# Patient Record
Sex: Female | Born: 1980 | Hispanic: No | Marital: Married | Smoking: Never smoker
Health system: Southern US, Community
[De-identification: ages and names within clinical notes are randomized; demographics above are authoritative.]

## PROBLEM LIST (undated history)

## (undated) DIAGNOSIS — O24419 Gestational diabetes mellitus in pregnancy, unspecified control: Secondary | ICD-10-CM

## (undated) HISTORY — PX: CHOLECYSTECTOMY: SHX55

---

## 2008-04-23 ENCOUNTER — Other Ambulatory Visit: Admission: RE | Admit: 2008-04-23 | Discharge: 2008-04-23 | Payer: Self-pay | Admitting: Unknown Physician Specialty

## 2008-04-23 ENCOUNTER — Encounter (INDEPENDENT_AMBULATORY_CARE_PROVIDER_SITE_OTHER): Payer: Self-pay | Admitting: Unknown Physician Specialty

## 2017-12-13 DIAGNOSIS — O24419 Gestational diabetes mellitus in pregnancy, unspecified control: Secondary | ICD-10-CM

## 2017-12-13 HISTORY — DX: Gestational diabetes mellitus in pregnancy, unspecified control: O24.419

## 2018-03-24 ENCOUNTER — Other Ambulatory Visit (INDEPENDENT_AMBULATORY_CARE_PROVIDER_SITE_OTHER): Payer: Self-pay | Admitting: Internal Medicine

## 2018-03-24 DIAGNOSIS — K8050 Calculus of bile duct without cholangitis or cholecystitis without obstruction: Secondary | ICD-10-CM

## 2018-03-27 ENCOUNTER — Encounter (HOSPITAL_COMMUNITY)
Admission: RE | Disposition: A | Discharge status: Other Acute Inpatient Hospital | Payer: Self-pay | Source: Ambulatory Visit | Attending: Internal Medicine

## 2018-03-27 ENCOUNTER — Ambulatory Visit (HOSPITAL_COMMUNITY): Payer: Medicaid Other | Admitting: Anesthesiology

## 2018-03-27 ENCOUNTER — Ambulatory Visit (HOSPITAL_COMMUNITY)
Admission: RE | Admit: 2018-03-27 | Discharge: 2018-03-27 | Disposition: A | Discharge status: Other Acute Inpatient Hospital | Payer: Medicaid Other | Source: Ambulatory Visit | Attending: Internal Medicine | Admitting: Internal Medicine

## 2018-03-27 ENCOUNTER — Ambulatory Visit (HOSPITAL_COMMUNITY): Payer: Medicaid Other

## 2018-03-27 ENCOUNTER — Encounter (HOSPITAL_COMMUNITY): Payer: Self-pay | Admitting: *Deleted

## 2018-03-27 DIAGNOSIS — Z01818 Encounter for other preprocedural examination: Secondary | ICD-10-CM | POA: Diagnosis not present

## 2018-03-27 DIAGNOSIS — K8050 Calculus of bile duct without cholangitis or cholecystitis without obstruction: Secondary | ICD-10-CM | POA: Diagnosis not present

## 2018-03-27 DIAGNOSIS — Z9049 Acquired absence of other specified parts of digestive tract: Secondary | ICD-10-CM | POA: Diagnosis not present

## 2018-03-27 DIAGNOSIS — R932 Abnormal findings on diagnostic imaging of liver and biliary tract: Secondary | ICD-10-CM | POA: Diagnosis not present

## 2018-03-27 DIAGNOSIS — Z79899 Other long term (current) drug therapy: Secondary | ICD-10-CM | POA: Insufficient documentation

## 2018-03-27 HISTORY — DX: Gestational diabetes mellitus in pregnancy, unspecified control: O24.419

## 2018-03-27 HISTORY — PX: REMOVAL OF STONES: SHX5545

## 2018-03-27 HISTORY — PX: ERCP: SHX5425

## 2018-03-27 HISTORY — PX: SPHINCTEROTOMY: SHX5544

## 2018-03-27 LAB — GLUCOSE, CAPILLARY: GLUCOSE-CAPILLARY: 98 mg/dL (ref 65–99)

## 2018-03-27 SURGERY — ERCP, WITH INTERVENTION IF INDICATED
Anesthesia: General

## 2018-03-27 MED ORDER — GLUCAGON HCL RDNA (DIAGNOSTIC) 1 MG IJ SOLR
INTRAMUSCULAR | Status: AC
  Filled 2018-03-27: qty 2

## 2018-03-27 MED ORDER — MIDAZOLAM HCL 2 MG/2ML IJ SOLN
INTRAMUSCULAR | Status: AC
  Filled 2018-03-27: qty 2

## 2018-03-27 MED ORDER — SUGAMMADEX SODIUM 200 MG/2ML IV SOLN
INTRAVENOUS | Status: DC | PRN
  Administered 2018-03-27: 200 mg via INTRAVENOUS

## 2018-03-27 MED ORDER — SIMETHICONE 40 MG/0.6ML PO SUSP
ORAL | Status: AC
  Filled 2018-03-27: qty 0.6

## 2018-03-27 MED ORDER — ROCURONIUM BROMIDE 100 MG/10ML IV SOLN
INTRAVENOUS | Status: DC | PRN
  Administered 2018-03-27: 5 mg via INTRAVENOUS

## 2018-03-27 MED ORDER — PROMETHAZINE HCL 25 MG/ML IJ SOLN: 6.2500 mg | INTRAMUSCULAR | Status: DC | PRN

## 2018-03-27 MED ORDER — MIDAZOLAM HCL 2 MG/2ML IJ SOLN: 0.5000 mg | Freq: Once | INTRAMUSCULAR | Status: DC | PRN

## 2018-03-27 MED ORDER — DEXAMETHASONE SODIUM PHOSPHATE 4 MG/ML IJ SOLN
INTRAMUSCULAR | Status: AC
  Filled 2018-03-27: qty 2

## 2018-03-27 MED ORDER — DEXAMETHASONE SODIUM PHOSPHATE 4 MG/ML IJ SOLN
INTRAMUSCULAR | Status: DC | PRN
  Administered 2018-03-27: 8 mg via INTRAVENOUS

## 2018-03-27 MED ORDER — CEFAZOLIN SODIUM-DEXTROSE 2-4 GM/100ML-% IV SOLN
2.0000 g | INTRAVENOUS | Status: AC
  Administered 2018-03-27: 2 g via INTRAVENOUS

## 2018-03-27 MED ORDER — LIDOCAINE HCL (PF) 1 % IJ SOLN
INTRAMUSCULAR | Status: AC
  Filled 2018-03-27: qty 5

## 2018-03-27 MED ORDER — ROCURONIUM BROMIDE 50 MG/5ML IV SOLN
INTRAVENOUS | Status: AC
  Filled 2018-03-27: qty 1

## 2018-03-27 MED ORDER — MIDAZOLAM HCL 5 MG/5ML IJ SOLN
INTRAMUSCULAR | Status: DC | PRN
  Administered 2018-03-27: 2 mg via INTRAVENOUS

## 2018-03-27 MED ORDER — CHLORHEXIDINE GLUCONATE CLOTH 2 % EX PADS: 6.0000 | MEDICATED_PAD | Freq: Once | CUTANEOUS | Status: DC

## 2018-03-27 MED ORDER — IOPAMIDOL (ISOVUE-M 300) INJECTION 61%
INTRAMUSCULAR | Status: DC | PRN
  Administered 2018-03-27: 50 mL

## 2018-03-27 MED ORDER — ONDANSETRON HCL 4 MG/2ML IJ SOLN
INTRAMUSCULAR | Status: DC | PRN
  Administered 2018-03-27: 4 mg via INTRAVENOUS

## 2018-03-27 MED ORDER — HYDROMORPHONE HCL 1 MG/ML IJ SOLN
0.2500 mg | INTRAMUSCULAR | Status: DC | PRN
  Administered 2018-03-27: 0.5 mg via INTRAVENOUS

## 2018-03-27 MED ORDER — FENTANYL CITRATE (PF) 100 MCG/2ML IJ SOLN
INTRAMUSCULAR | Status: DC | PRN
  Administered 2018-03-27: 15 ug via INTRAVENOUS

## 2018-03-27 MED ORDER — ONDANSETRON HCL 4 MG/2ML IJ SOLN
INTRAMUSCULAR | Status: AC
  Filled 2018-03-27: qty 2

## 2018-03-27 MED ORDER — FENTANYL CITRATE (PF) 100 MCG/2ML IJ SOLN
INTRAMUSCULAR | Status: AC
  Filled 2018-03-27: qty 2

## 2018-03-27 MED ORDER — PROPOFOL 10 MG/ML IV BOLUS
INTRAVENOUS | Status: DC | PRN
  Administered 2018-03-27: 160 mg via INTRAVENOUS

## 2018-03-27 MED ORDER — IOPAMIDOL (ISOVUE-300) INJECTION 61%
INTRAVENOUS | Status: AC
  Filled 2018-03-27: qty 100

## 2018-03-27 MED ORDER — HYDROMORPHONE HCL 1 MG/ML IJ SOLN
INTRAMUSCULAR | Status: AC
  Filled 2018-03-27: qty 0.5

## 2018-03-27 MED ORDER — CEFAZOLIN SODIUM-DEXTROSE 2-4 GM/100ML-% IV SOLN
INTRAVENOUS | Status: AC
  Filled 2018-03-27: qty 100

## 2018-03-27 MED ORDER — SUCCINYLCHOLINE CHLORIDE 20 MG/ML IJ SOLN
INTRAMUSCULAR | Status: DC | PRN
  Administered 2018-03-27: 120 mg via INTRAVENOUS

## 2018-03-27 MED ORDER — GLUCAGON HCL RDNA (DIAGNOSTIC) 1 MG IJ SOLR
INTRAMUSCULAR | Status: DC | PRN
  Administered 2018-03-27 (×2): 0.25 mg via INTRAVENOUS

## 2018-03-27 MED ORDER — PROPOFOL 10 MG/ML IV BOLUS
INTRAVENOUS | Status: AC
  Filled 2018-03-27: qty 40

## 2018-03-27 MED ORDER — STERILE WATER FOR IRRIGATION IR SOLN
Status: DC | PRN
  Administered 2018-03-27: 1000 mL

## 2018-03-27 MED ORDER — LACTATED RINGERS IV SOLN
INTRAVENOUS | Status: DC
  Administered 2018-03-27: 11:00:00 via INTRAVENOUS

## 2018-03-27 MED ORDER — ACETAMINOPHEN 10 MG/ML IV SOLN: 1000.0000 mg | Freq: Once | INTRAVENOUS | Status: DC | PRN

## 2018-03-27 NOTE — Transfer of Care (Signed)
Immediate Anesthesia Transfer of Care Note  Patient: Jasmine HumblesYuriana Ford  Procedure(s) Performed: ENDOSCOPIC RETROGRADE CHOLANGIOPANCREATOGRAPHY (ERCP) (N/A ) REMOVAL OF STONES (N/A ) SPHINCTEROTOMY  Patient Location: PACU  Anesthesia Type:General  Level of Consciousness: awake and patient cooperative  Airway & Oxygen Therapy: Patient Spontanous Breathing and Patient connected to face mask oxygen  Post-op Assessment: Report given to RN, Post -op Vital signs reviewed and stable and Patient moving all extremities  Post vital signs: Reviewed and stable  Last Vitals:  Vitals Value Taken Time  BP    Temp    Pulse    Resp    SpO2      Last Pain:  Vitals:   03/27/18 1047  TempSrc: Oral  PainSc: 0-No pain         Complications: No apparent anesthesia complications

## 2018-03-27 NOTE — Anesthesia Postprocedure Evaluation (Signed)
Anesthesia Post Note  Patient: [name]  Procedure(s) Performed: ENDOSCOPIC RETROGRADE CHOLANGIOPANCREATOGRAPHY (ERCP) (N/A ) REMOVAL OF STONES (N/A ) SPHINCTEROTOMY  Patient location during evaluation: PACU Anesthesia Type: General Level of consciousness: awake and alert and patient cooperative Pain management: pain level controlled Vital Signs Assessment: post-procedure vital signs reviewed and stable Respiratory status: spontaneous breathing, nonlabored ventilation and respiratory function stable Cardiovascular status: blood pressure returned to baseline Postop Assessment: no apparent nausea or vomiting Anesthetic complications: no     Last Vitals:  Vitals:   03/27/18 1047 03/27/18 1312  BP:  127/81  Pulse: 67 85  Resp: (!) 25 (!) 22  Temp: 37.3 C (P) 37.1 C  SpO2: 90%     Last Pain:  Vitals:   03/27/18 1047  TempSrc: Oral  PainSc: 0-No pain                 Annet Manukyan J

## 2018-03-27 NOTE — Anesthesia Preprocedure Evaluation (Addendum)
Anesthesia Evaluation  Patient identified by MRN, date of birth, ID band Patient awake    Reviewed: Allergy & Precautions, H&P , NPO status , Patient's Chart, lab work & pertinent test results, reviewed documented beta blocker date and time   Airway Mallampati: II  TM Distance: >3 FB Neck ROM: full    Dental no notable dental hx. (+) Teeth Intact   Pulmonary neg pulmonary ROS,    Pulmonary exam normal breath sounds clear to auscultation       Cardiovascular Exercise Tolerance: Good negative cardio ROS   Rhythm:regular Rate:Normal     Neuro/Psych negative neurological ROS  negative psych ROS   GI/Hepatic negative GI ROS, Neg liver ROS,   Endo/Other  negative endocrine ROSdiabetes  Renal/GU negative Renal ROS  negative genitourinary   Musculoskeletal   Abdominal   Peds  Hematology negative hematology ROS (+)   Anesthesia Other Findings Generally healthy with no clinical complaints; Lab studies from outside facility showed H&H 11.4/35.0; BMP reviewed  Reproductive/Obstetrics negative OB ROS                            Anesthesia Physical Anesthesia Plan  ASA: II  Anesthesia Plan: General   Post-op Pain Management:    Induction:   PONV Risk Score and Plan:   Airway Management Planned:   Additional Equipment:   Intra-op Plan:   Post-operative Plan:   Informed Consent: I have reviewed the patients History and Physical, chart, labs and discussed the procedure including the risks, benefits and alternatives for the proposed anesthesia with the patient or authorized representative who has indicated his/her understanding and acceptance.   Dental Advisory Given  Plan Discussed with: CRNA  Anesthesia Plan Comments:        Anesthesia Quick Evaluation

## 2018-03-27 NOTE — Progress Notes (Signed)
Patient waiting for EMS transportation return to [facility] [place], [place]

## 2018-03-27 NOTE — Anesthesia Postprocedure Evaluation (Signed)
Anesthesia Post Note  Patient: Leverne HumblesYuriana Borcherding  Procedure(s) Performed: ENDOSCOPIC RETROGRADE CHOLANGIOPANCREATOGRAPHY (ERCP) (N/A ) REMOVAL OF STONES (N/A ) SPHINCTEROTOMY  Patient location during evaluation: PACU Anesthesia Type: General Level of consciousness: awake and alert Pain management: pain level controlled Vital Signs Assessment: post-procedure vital signs reviewed and stable Respiratory status: spontaneous breathing, nonlabored ventilation, respiratory function stable and patient connected to nasal cannula oxygen Cardiovascular status: stable and blood pressure returned to baseline Postop Assessment: no apparent nausea or vomiting Anesthetic complications: no     Last Vitals:  Vitals:   03/27/18 1047  Pulse: 67  Resp: (!) 25  Temp: 37.3 C  SpO2: 90%    Last Pain:  Vitals:   03/27/18 1047  TempSrc: Oral  PainSc: 0-No pain                 Allena EaringJeffrey C Kayli Beal

## 2018-03-27 NOTE — Op Note (Signed)
[facility] Patient Name: Jasmine Ford Procedure Date: 03/27/2018 11:57 AM MRN: [id number] Date of Birth: 04-28-1981 Attending MD: Lionel December , MD CSN: [id number] Age: 37 Admit Type: Outpatient Procedure:                ERCP Indications:              For therapy of bile duct stone(s) Providers:                Lionel December, MD, Edrick Kins, RN, Burke Keels, Technician Referring MD:             Justice Britain, MD Medicines:                General Anesthesia Complications:            No immediate complications. Estimated Blood Loss:     Estimated blood loss: none. Procedure:                Pre-Anesthesia Assessment:                           - Prior to the procedure, a History and Physical                            was performed, and patient medications and                            allergies were reviewed. The patient's tolerance of                            previous anesthesia was also reviewed. The risks                            and benefits of the procedure and the sedation                            options and risks were discussed with the patient.                            All questions were answered, and informed consent                            was obtained. Prior Anticoagulants: The patient has                            taken no previous anticoagulant or antiplatelet                            agents. ASA Grade Assessment: I - A normal, healthy                            patient. After reviewing the risks and benefits,  the patient was deemed in satisfactory condition to                            undergo the procedure.                           After obtaining informed consent, the scope was                            passed under direct vision. Throughout the                            procedure, the patient's blood pressure, pulse, and                            oxygen saturations were monitored  continuously. The                            [id number]) scope was introduced through                            the mouth, and used to inject contrast into and                            used to inject contrast into the bile duct. The                            ERCP was accomplished without difficulty. The                            patient tolerated the procedure well. Scope In: 12:30:54 PM Scope Out: 12:51:35 PM Total Procedure Duration: 0 hours 20 minutes 41 seconds  Findings:      The scout film was normal. The esophagus was successfully intubated       under direct vision. The scope was advanced to a normal major papilla in       the descending duodenum without detailed examination of the pharynx,       larynx and associated structures, and upper GI tract. The upper GI tract       was grossly normal. PD was initially cannulated. Guidewire left in PD to       facilitate CBD cannulation. Contrast was not injexted into PD. 0.035       inch x 260 cm straight Hydra Jagwire was passed into the biliary tree.       The traction (standard) sphincterotome and Autotome sphincterotome were       passed over the guidewire and the bile duct was then deeply cannulated.       Contrast was injected. I personally interpreted the bile duct images.       Ductal flow of contrast was adequate. Image quality was excellent.       Contrast extended to the main bile duct. Contrast extended to the       hepatic ducts. The common bile duct contained filling defect(s) thought       to be a stone. An 8 mm biliary sphincterotomy was made with a braided  Autotome sphincterotome using ERBE electrocautery. There was no       post-sphincterotomy bleeding. The biliary tree was swept with a 9 mm       balloon starting at the upper third of the main bile duct. Four stones       were removed. No stones remained. Impression:               - Multiple(4) filling defects consistent with                             stones was seen on the cholangiogram.                           - A biliary sphincterotomy was performed.                           - Complete removal was accomplished by biliary                            sphincterotomy and balloon extraction. Moderate Sedation:      Per Anesthesia Care Recommendation:           - Avoid aspirin and nonsteroidal anti-inflammatory                            medicines for 3 days.                           - Return patient to referring hospital for ongoing                            care.                           - Clear liquid diet today.                           - Low sodium diet today. Procedure Code(s):        --- Professional ---                           [phone], Endoscopic retrograde                            cholangiopancreatography (ERCP); with removal of                            calculi/debris from biliary/pancreatic duct(s)                           43262, Endoscopic retrograde                            cholangiopancreatography (ERCP); with                            sphincterotomy/papillotomy Diagnosis Code(s):        --- Professional ---  K80.50, Calculus of bile duct without cholangitis                            or cholecystitis without obstruction                           R93.2, Abnormal findings on diagnostic imaging of                            liver and biliary tract CPT copyright 2017 American Medical Association. All rights reserved. The codes documented in this report are preliminary and upon coder review may  be revised to meet current compliance requirements. Lionel December, MD Lionel December, MD 03/27/2018 1:24:18 PM This report has been signed electronically. Number of Addenda: 0

## 2018-03-27 NOTE — Progress Notes (Signed)
Brief ERCP note:  Normal ampulla of Vater. Pancreatic duct cannulated with guidewire no contrast injected. Pancreatic guidewire left in place to facilitate selective cannulation of bile duct. Mildly dilated CBD and CHD. 4 filling defects consistent with stones. Biliary sphincterotomy performed and all stones were removed using stone balloon extractor. Patient tolerated the procedure well.

## 2018-03-27 NOTE — Anesthesia Procedure Notes (Signed)
Procedure Name: Intubation Date/Time: 03/27/2018 12:15 PM Performed by: Charmaine Downs, CRNA Pre-anesthesia Checklist: Patient identified, Patient being monitored, Timeout performed, Emergency Drugs available and Suction available Patient Re-evaluated:Patient Re-evaluated prior to induction Oxygen Delivery Method: Circle System Utilized Preoxygenation: Pre-oxygenation with 100% oxygen Induction Type: IV induction Ventilation: Mask ventilation without difficulty Laryngoscope Size: Mac and 4 Grade View: Grade I Tube type: Oral Tube size: 7.0 mm Number of attempts: 1 Airway Equipment and Method: stylet Placement Confirmation: ETT inserted through vocal cords under direct vision,  positive ETCO2 and breath sounds checked- equal and bilateral Secured at: 22 cm Tube secured with: Tape Dental Injury: Teeth and Oropharynx as per pre-operative assessment

## 2018-03-27 NOTE — Discharge Instructions (Signed)
No aspirin or anticoagulants for 72 hours. Clear liquids today.  Advance heart healthy diet starting tomorrow morning. Check LFTs and serum amylase in a.m.      General Anesthesia, Adult, Care After These instructions provide you with information about caring for yourself after your procedure. Your health care provider may also give you more specific instructions. Your treatment has been planned according to current medical practices, but problems sometimes occur. Call your health care provider if you have any problems or questions after your procedure. What can I expect after the procedure? After the procedure, it is common to have:  Vomiting.  A sore throat.  Mental slowness.  It is common to feel:  Nauseous.  Cold or shivery.  Sleepy.  Tired.  Sore or achy, even in parts of your body where you did not have surgery.  Follow these instructions at home: For at least 24 hours after the procedure:  Do not: ? Participate in activities where you could fall or become injured. ? Drive. ? Use heavy machinery. ? Drink alcohol. ? Take sleeping pills or medicines that cause drowsiness. ? Make important decisions or sign legal documents. ? Take care of children on your own.  Rest. Eating and drinking  If you vomit, drink water, juice, or soup when you can drink without vomiting.  Drink enough fluid to keep your urine clear or pale yellow.  Make sure you have little or no nausea before eating solid foods.  Follow the diet recommended by your health care provider. General instructions  Have a responsible adult stay with you until you are awake and alert.  Return to your normal activities as told by your health care provider. Ask your health care provider what activities are safe for you.  Take over-the-counter and prescription medicines only as told by your health care provider.  If you smoke, do not smoke without supervision.  Keep all follow-up visits as told by  your health care provider. This is important. Contact a health care provider if:  You continue to have nausea or vomiting at home, and medicines are not helpful.  You cannot drink fluids or start eating again.  You cannot urinate after 8-12 hours.  You develop a skin rash.  You have fever.  You have increasing redness at the site of your procedure. Get help right away if:  You have difficulty breathing.  You have chest pain.  You have unexpected bleeding.  You feel that you are having a life-threatening or urgent problem. This information is not intended to replace advice given to you by your health care provider. Make sure you discuss any questions you have with your health care provider. Document Released: 03/07/2001 Document Revised: 05/03/2016 Document Reviewed: 11/13/2015 Elsevier Interactive Patient Education  2018 [employer]      Anestesia general en los adultos, cuidados posteriores (General Anesthesia, Adult, Care After) Estas indicaciones le proporcionan informacin acerca de cmo deber cuidarse despus del procedimiento. El mdico tambin podr darle instrucciones ms especficas. El tratamiento ha sido planificado segn las prcticas mdicas actuales, pero en algunos casos pueden ocurrir problemas. Comunquese con el mdico si tiene algn problema o dudas despus del procedimiento. QU ESPERAR DESPUS DEL PROCEDIMIENTO Despus del procedimiento, es comn DIRECTVtener los siguientes sntomas:  Vmitos.  Dolor de Advertising copywritergarganta.  Lentitud mental. Es normal sentir lo siguiente:  Nuseas.  Fro o escalofros.  Somnolencia.  Cansancio.  Dolor o inflamacin, incluso en partes del cuerpo no afectadas por la ciruga. INSTRUCCIONES PARA EL CUIDADO  EN EL HOGAR Durante al menos 24horas despus del procedimiento:  No haga lo siguiente: ? Participar en actividades que impliquen posibles cadas o lesiones. ? Conducir vehculos. ? Operar maquinarias pesadas. ? Beber  alcohol. ? Tomar somnferos o medicamentos que causen somnolencia. ? Firmar documentos legales ni tomar [employer]. ? Cuidar a nios por su cuenta.  Hacer reposo. Comida y bebida  Si vomita, tome agua, jugo o sopa una vez que pueda beber sin vomitar.  Beba suficiente lquido para Photographer orina clara o de color amarillo plido.  Asegrese de no tener nuseas antes de ingerir alimentos slidos.  Siga la dieta recomendada por el mdico. Instrucciones generales  Permanezca con un adulto responsable hasta que est completamente despierto y consciente.  Retome sus actividades normales como se lo haya indicado el mdico. Pregntele al mdico qu actividades son seguras para usted.  Tome los medicamentos de venta libre y los recetados solamente como se lo haya indicado el mdico.  Si fuma, no lo haga sin supervisin.  Concurra a todas las visitas de control como se lo haya indicado el mdico. Esto es importante. SOLICITE ATENCIN MDICA SI:  Contina con nuseas o vmitos en su casa, y los medicamentos no ayudan.  No puede beber lquidos ni volver a comer.  No puede orinar despus de 8 a 12horas.  Tiene una erupcin cutnea.  Tiene fiebre.  Tiene cada vez ms enrojecimiento en la zona de la ciruga. SOLICITE ATENCIN MDICA DE INMEDIATO SI:  Tiene dificultad para respirar.  Siente dolor en el pecho.  Tiene una hemorragia imprevista.  Siente que tiene un problema potencialmente mortal o urgente. Esta informacin no tiene Theme park manager el consejo del mdico. Asegrese de hacerle al mdico cualquier pregunta que tenga. Document Released: 11/29/2005 Document Revised: 12/20/2014 Document Reviewed: 11/13/2015 Elsevier Interactive Patient Education  2018 Elsevier Inc.     Colangiopancreatografa retrgrada endoscpica (CPRE) Cuidados posteriores (Endoscopic Retrograde Cholangiopancreatography (ERCP), Care After) Siga estas instrucciones durante las  prximas semanas. Estas indicaciones le proporcionan informacin general acerca de cmo deber cuidarse despus del procedimiento. El mdico tambin podr darle instrucciones ms especficas. El tratamiento se ha planificado de acuerdo a las prcticas mdicas actuales, pero a veces se producen problemas. Comunquese con el mdico si tiene algn problema o tiene dudas despus del procedimiento. QU ESPERAR DESPUS DEL PROCEDIMIENTO Despus del procedimiento, es tpico tener las siguientes sensaciones:  Dance movement psychotherapist.  Ganas de vomitar (nuseas).  Hinchazn.  Mareos.  Cansancio. INSTRUCCIONES PARA EL CUIDADO EN EL HOGAR  Pdale a algn amigo o familiar que se quede con usted durante las primeras 24 horas despus del procedimiento.  Comience a tomar sus medicamentos habituales y a comer normalmente tan pronto como se sienta lo suficientemente bien, o segn las indicaciones de su mdico.  SOLICITE ATENCIN MDICA SI:  Siente dolor abdominal.  Tiene signos de infeccin como: ? Escalofros. ? No se siente bien.  SOLICITE ATENCIN MDICA DE INMEDIATO SI:  Tiene dificultad para tragar.  Siente un dolor abdominal, en la garganta o en el pecho cada vez ms intenso.  Vomita.  La materia fecal es negra o tiene [place].  Tiene fiebre.  Esta informacin no tiene Theme park manager el consejo del mdico. Asegrese de hacerle al mdico cualquier pregunta que tenga. Document Released: 09/19/2013 Document Revised: 09/19/2013 Document Reviewed: 06/04/2013 Elsevier Interactive Patient Education  2017 [employer].

## 2018-03-27 NOTE — H&P (Signed)
Jasmine Ford is an 37 y.o. female.   Chief Complaint: Patient is here for ERCP. HPI: Patient is 37 year old Hispanic female who was found to have cholelithiasis.  She had ultrasound during her pregnancy.  However she was asymptomatic.  She began to have back pain radiating to right upper quadrant about 2 weeks after pregnancy was over.  She was found to have cholelithiasis as well as dilated bile duct.  She underwent laparoscopic cholecystectomy by Dr. Justice Ford on 03/23/2018.  Intraoperative cholangiogram was performed and revealed multiple stones and dilated bile duct which measured 15 mm.  Given these findings the left JP drain in.  Postprocedure patient has not experience nausea vomiting fever or chills.  She has had some pain relieved with morphine.  She did notice some itching and has been given Benadryl with relief. Patient does not speak [place].  Her daughter Jasmine Ford help with interpretation. She denies abdominal pain nausea or vomiting. She has had good appetite.  She denies unintentional weight loss.  She does not take any medications on a regular basis. She does not smoke cigarettes or drink alcohol. Family history significant for diabetes mellitus in her sister who was in her 81s.  LFTs noted from 03/26/2018.  Transaminases and alkaline phosphatase are elevated.  Past Medical History:  Diagnosis Date  . Gestational diabetes 2019    Past Surgical History:  Procedure Laterality Date  .     Marland Kitchen CHOLECYSTECTOMY     03/2018    History reviewed. No pertinent family history. Social History:  reports that she has never smoked. She has never used smokeless tobacco. Her alcohol and drug histories are not on file.  Allergies:  Allergies  Allergen Reactions  . Morphine And Related Itching    Medications Prior to Admission  Medication Sig Dispense Refill  . diphenhydrAMINE (BENADRYL) 25 MG tablet Take 25 mg by mouth every 6 (six) hours as needed for allergies.      Results for  orders placed or performed during the hospital encounter of 03/27/18 (from the past 48 hour(s))  Glucose, capillary     Status: None   Collection Time: 03/27/18 10:58 AM  Result Value Ref Range   Glucose-Capillary 98 65 - 99 mg/dL   No results found.  ROS  Pulse 67, temperature 99.1 F (37.3 C), temperature source Oral, resp. rate (!) 25, height 5\' 3"  (1.6 m), weight 160 lb (72.6 kg), SpO2 90 %. Physical Exam  Constitutional: She appears well-developed and well-nourished.  HENT:  Mouth/Throat: Oropharynx is clear and moist.  Eyes: Conjunctivae are normal. No scleral icterus.  Neck: No thyromegaly present.  Cardiovascular: Normal rate, regular rhythm and normal heart sounds.  No murmur heard. Respiratory: Effort normal and breath sounds normal.  GI:  Abdomen is symmetrical.  She has dressing covering most of her right upper quadrant.  JP drain in place.  There is scant amount of serosanguineous fluid in the back.  Abdomen is soft and nontender without organomegaly or masses.  Musculoskeletal: She exhibits no edema.  Lymphadenopathy:    She has no cervical adenopathy.  Neurological: She is alert.  Skin: Skin is warm and dry.     Assessment/Plan Choledocholithiasis. Patient is status post cholecystectomy 4 days ago. ERCP with sphincterotomy and stone extraction. Procedure and risks reviewed with the patient with the help of her daughter Jasmine Ford as patient does not speak [place].  All their questions answered.  She is agreeable to proceed with the procedure. Patient will be returning to [facility] [place] following the  procedure.  Jasmine DecemberNajeeb Darlette Dubow, MD 03/27/2018, 11:13 AM

## 2018-04-03 ENCOUNTER — Encounter (HOSPITAL_COMMUNITY): Payer: Self-pay | Admitting: Internal Medicine

## 2018-10-09 IMAGING — RF DG ERCP WO/W SPHINCTEROTOMY
1 series · 8 of 8 positions shown · non-contrast
Comparison: None

CLINICAL DATA: 36-year-old female with a history of
choledocholithiasis

EXAM:
ERCP
TECHNIQUE: Multiple spot images obtained with the fluoroscopic device and
submitted for interpretation post-procedure.
FLUOROSCOPY TIME:  Fluoroscopy Time:  3 minutes 41 seconds

[Series 1: run · 2 acquisitions, 8 frames shown]
[im 1/2]
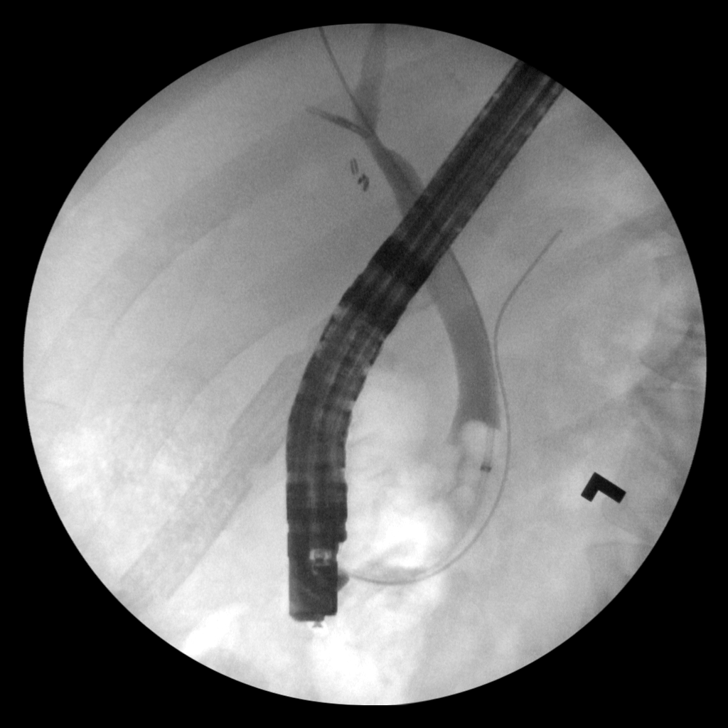
[im 1/2]
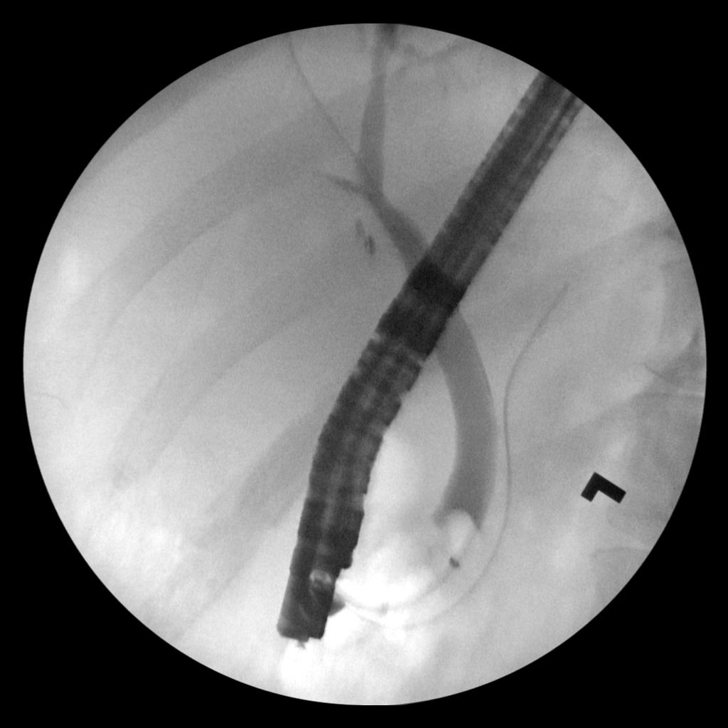
[im 1/2]
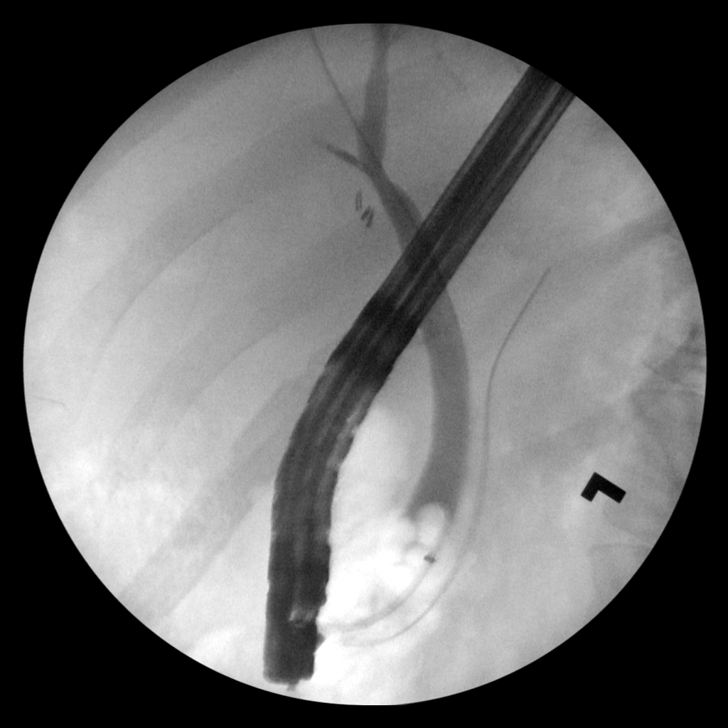
[im 1/2]
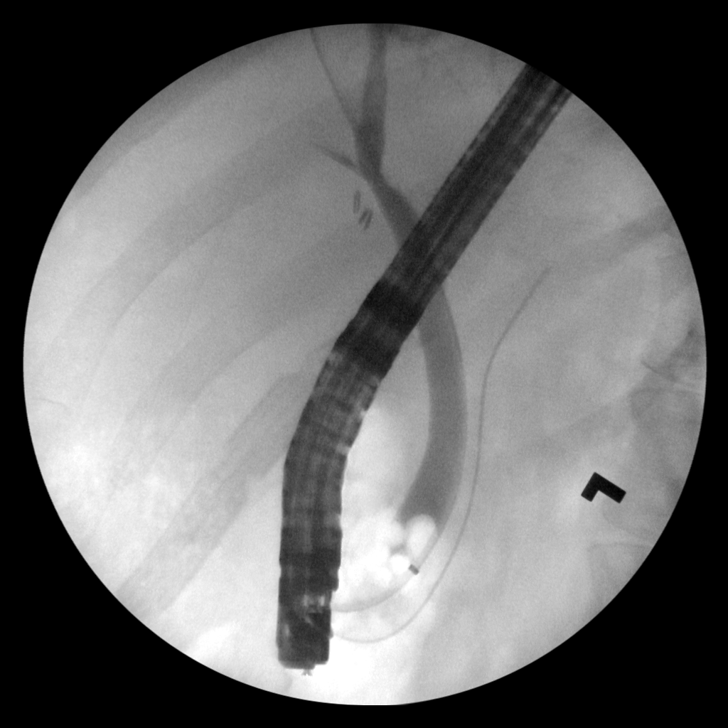
[im 2/2]
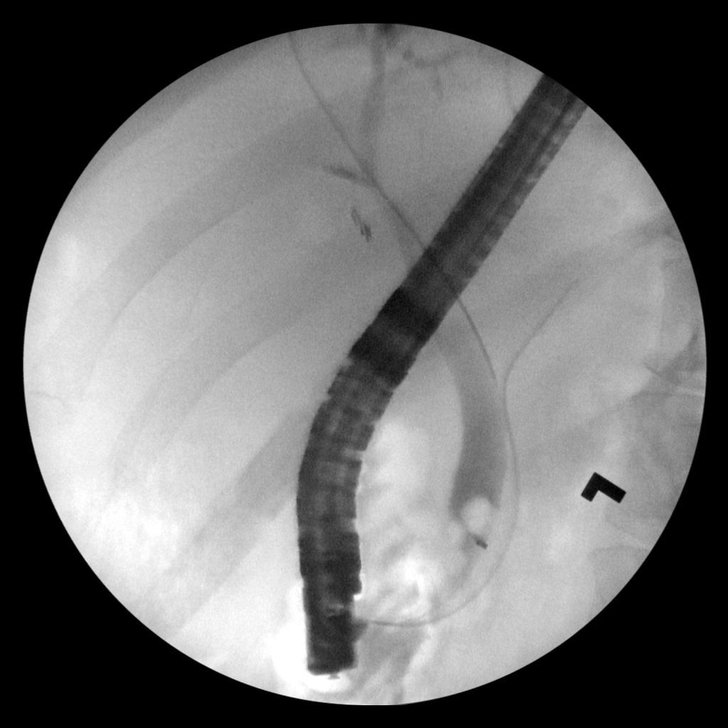
[im 2/2]
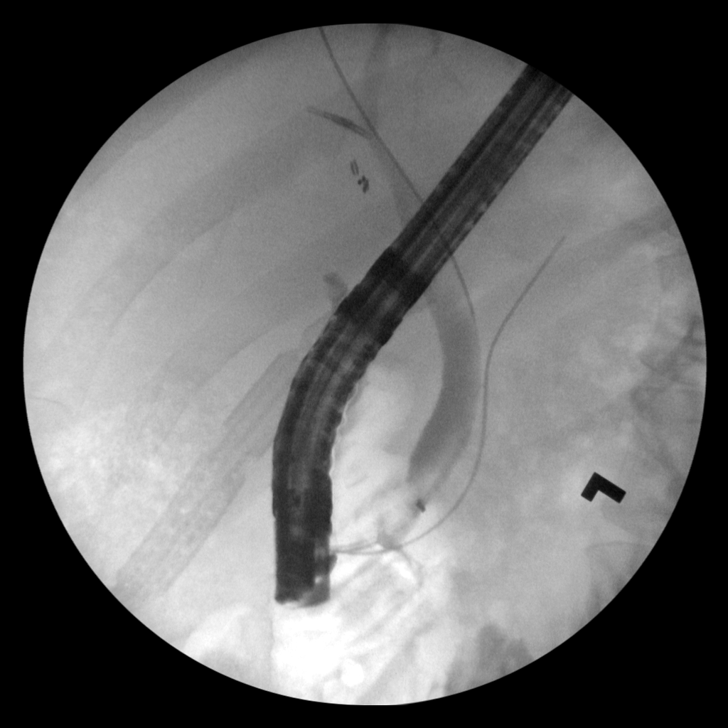
[im 2/2]
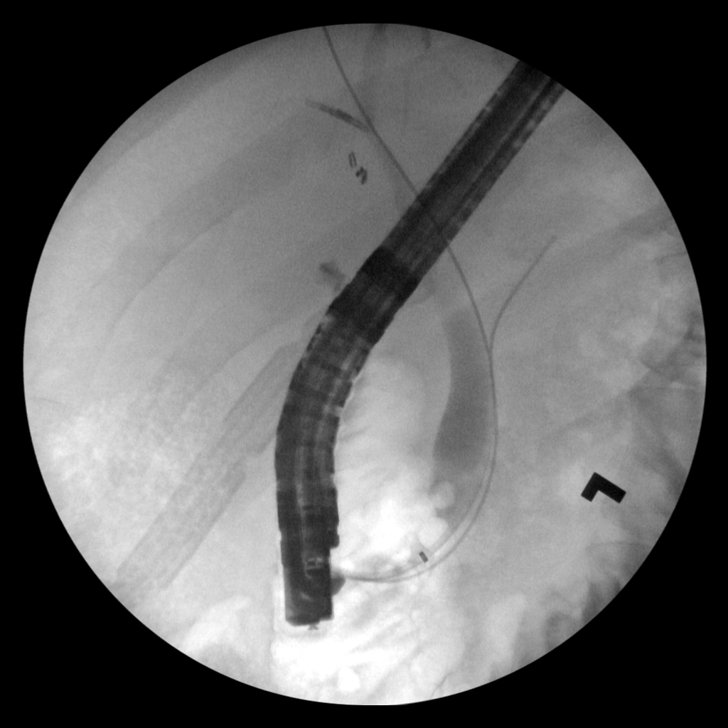
[im 2/2]
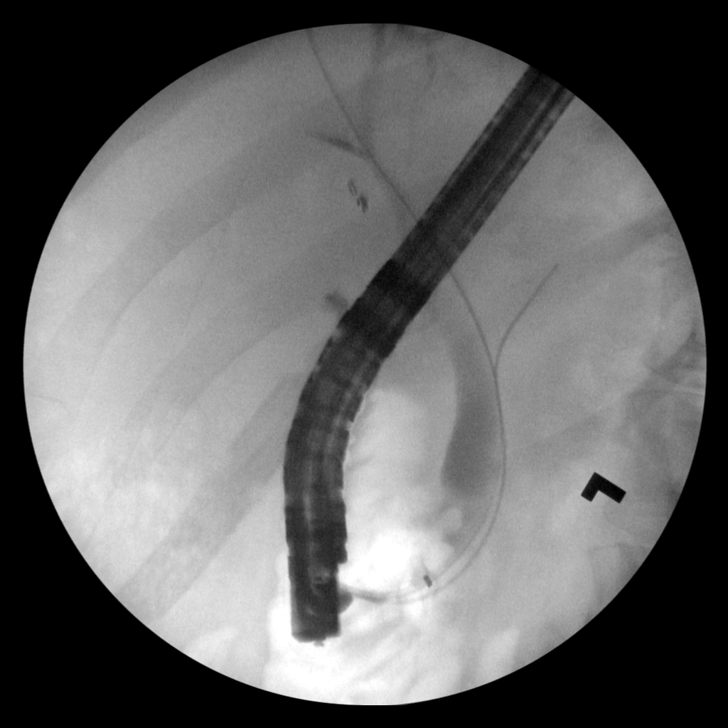

[8 of 8 positions shown; findings below may reference images not displayed]

FINDINGS: Limited intraoperative fluoroscopic spot images during ERCP.

Initial image demonstrates endoscope projecting over the upper
abdomen. Subsequently there is cannulation of the ampulla and
retrograde passage of a safety wire.

Partial opacification of the extrahepatic biliary system with
ill-defined rounded filling defects present.

Deployment of a retrieval balloon.

Surgical changes of cholecystectomy.
IMPRESSION: Limited intraoperative fluoroscopic spot images of ERCP demonstrate
treatment of presumed choledocholithiasis with deployment of a
retrieval balloon.

Please refer to the dictated operative report for full details of
intraoperative findings and procedure.

## 2024-10-05 ENCOUNTER — Emergency Department (HOSPITAL_BASED_OUTPATIENT_CLINIC_OR_DEPARTMENT_OTHER)
Admission: EM | Admit: 2024-10-05 | Discharge: 2024-10-05 | Disposition: A | Discharge status: Home / Self Care | Attending: Emergency Medicine | Admitting: Emergency Medicine

## 2024-10-05 DIAGNOSIS — J0100 Acute maxillary sinusitis, unspecified: Secondary | ICD-10-CM | POA: Insufficient documentation

## 2024-10-05 DIAGNOSIS — E119 Type 2 diabetes mellitus without complications: Secondary | ICD-10-CM | POA: Diagnosis not present

## 2024-10-05 DIAGNOSIS — R059 Cough, unspecified: Secondary | ICD-10-CM | POA: Diagnosis present

## 2024-10-05 LAB — RESP PANEL BY RT-PCR (RSV, FLU A&B, COVID)  RVPGX2
Influenza A by PCR: NEGATIVE
Influenza B by PCR: NEGATIVE
Resp Syncytial Virus by PCR: NEGATIVE
SARS Coronavirus 2 by RT PCR: NEGATIVE

## 2024-10-05 MED ORDER — AMOXICILLIN 500 MG PO CAPS: 500.0000 mg | ORAL_CAPSULE | Freq: Three times a day (TID) | ORAL | 0 refills | Status: AC

## 2024-10-05 NOTE — ED Provider Notes (Signed)
  [facility] AT [facility] Provider Note   CSN: [id number] Arrival date & time: 10/05/24  0151     Patient presents with: Cough   Jasmine Ford is a 43 y.o. female.   The history is provided by the patient and a relative. The history is limited by a language barrier. A language interpreter was used rainey GLENWOOD Dama [phone]).  Cough Associated symptoms: shortness of breath   Associated symptoms: no sore throat   Patient reports recent cough/congestion for past week, and over past several days she has had sinus pressure No sore throat She reports cough with white sputum (no hemoptysis) Reports last fever was over 24 hrs ago No recent travel She reports h/o DM She is a nonsmoker      Prior to Admission medications   Medication Sig Start Date End Date Taking? Authorizing Provider  amoxicillin (AMOXIL) 500 MG capsule Take 1 capsule (500 mg total) by mouth 3 (three) times daily. 10/05/24  Yes Midge Golas, MD  diphenhydrAMINE (BENADRYL) 25 MG tablet Take 25 mg by mouth every 6 (six) hours as needed for allergies.    [provider]    Allergies: Morphine and codeine    Review of Systems  HENT:  Negative for sore throat.   Respiratory:  Positive for cough and shortness of breath.   Gastrointestinal:  Negative for vomiting.    Updated Vital Signs BP 124/66   Pulse 72   Temp 98.5 F (36.9 C) (Oral)   Resp 18   LMP 09/03/2024 (Approximate)   SpO2 100%   Physical Exam CONSTITUTIONAL: Well developed/well nourished, no distress HEAD: Normocephalic/atraumatic EYES: EOMI/PERRL ENMT: Mucous membranes moist, uvula midline, no erythema or exudate No stridor or drooling.  Bilateral TMs clear and intact Mild tenderness to maxillary sinus No oral lesions noted CV: S1/S2 noted, no murmurs/rubs/gallops noted LUNGS: Lungs are clear to auscultation bilaterally, no apparent distress NEURO: Pt is awake/alert/appropriate, moves all  extremitiesx4.  No facial droop.    (all labs ordered are listed, but only abnormal results are displayed) Labs Reviewed  RESP PANEL BY RT-PCR (RSV, FLU A&B, COVID)  [id number]    EKG: None  Radiology: No results found.   Procedures   Medications Ordered in the ED - No data to display                                  Medical Decision Making Risk Prescription drug management.   This patient is well-appearing.  She had a recent upper respiratory infection for several days, now having facial pain and congestion.  She has taken OTC meds without relief.  Viral panel was reviewed and negative.  Suspect acute sinusitis, will start her on amoxicillin. Her lung sounds are clear, no indication for chest x-ray     Final diagnoses:  Acute non-recurrent maxillary sinusitis    ED Discharge Orders          Ordered    amoxicillin (AMOXIL) 500 MG capsule  3 times daily        10/05/24 0456               Midge Golas, MD 10/05/24 [phone]

## 2024-10-05 NOTE — ED Notes (Signed)
 ED Provider at bedside.

## 2024-10-05 NOTE — ED Triage Notes (Signed)
 Pt has had cough, congestion and sinus pressure x 1 week. Other family members are sick with same symptoms.
